# Patient Record
Sex: Female | Born: 1935 | Race: White | Hispanic: No | Smoking: Never smoker
Health system: Southern US, Community
[De-identification: ages and names within clinical notes are randomized; demographics above are authoritative.]

## PROBLEM LIST (undated history)

## (undated) DIAGNOSIS — I10 Essential (primary) hypertension: Secondary | ICD-10-CM

## (undated) DIAGNOSIS — I4891 Unspecified atrial fibrillation: Secondary | ICD-10-CM

---

## 2001-06-22 ENCOUNTER — Encounter: Admission: RE | Admit: 2001-06-22 | Discharge: 2001-06-22 | Payer: Self-pay | Admitting: Internal Medicine

## 2001-06-22 ENCOUNTER — Encounter: Payer: Self-pay | Admitting: Internal Medicine

## 2003-04-06 ENCOUNTER — Ambulatory Visit (HOSPITAL_COMMUNITY): Admission: RE | Admit: 2003-04-06 | Discharge: 2003-04-06 | Payer: Self-pay | Admitting: Internal Medicine

## 2016-05-01 ENCOUNTER — Emergency Department (HOSPITAL_COMMUNITY): Payer: Medicare HMO

## 2016-05-01 ENCOUNTER — Emergency Department (HOSPITAL_COMMUNITY)
Admission: EM | Admit: 2016-05-01 | Discharge: 2016-05-01 | Disposition: A | Discharge status: Home / Self Care | Payer: Medicare HMO | Attending: Emergency Medicine | Admitting: Emergency Medicine

## 2016-05-01 ENCOUNTER — Encounter (HOSPITAL_COMMUNITY): Payer: Self-pay | Admitting: Emergency Medicine

## 2016-05-01 DIAGNOSIS — S50311A Abrasion of right elbow, initial encounter: Secondary | ICD-10-CM | POA: Insufficient documentation

## 2016-05-01 DIAGNOSIS — S0990XA Unspecified injury of head, initial encounter: Secondary | ICD-10-CM | POA: Diagnosis present

## 2016-05-01 DIAGNOSIS — I10 Essential (primary) hypertension: Secondary | ICD-10-CM | POA: Diagnosis not present

## 2016-05-01 DIAGNOSIS — Y999 Unspecified external cause status: Secondary | ICD-10-CM | POA: Insufficient documentation

## 2016-05-01 DIAGNOSIS — Y929 Unspecified place or not applicable: Secondary | ICD-10-CM | POA: Diagnosis not present

## 2016-05-01 DIAGNOSIS — Z7901 Long term (current) use of anticoagulants: Secondary | ICD-10-CM | POA: Diagnosis not present

## 2016-05-01 DIAGNOSIS — S0181XA Laceration without foreign body of other part of head, initial encounter: Secondary | ICD-10-CM | POA: Diagnosis not present

## 2016-05-01 DIAGNOSIS — Y939 Activity, unspecified: Secondary | ICD-10-CM | POA: Insufficient documentation

## 2016-05-01 DIAGNOSIS — T148XXA Other injury of unspecified body region, initial encounter: Secondary | ICD-10-CM

## 2016-05-01 DIAGNOSIS — Z79899 Other long term (current) drug therapy: Secondary | ICD-10-CM | POA: Diagnosis not present

## 2016-05-01 DIAGNOSIS — W1839XA Other fall on same level, initial encounter: Secondary | ICD-10-CM | POA: Insufficient documentation

## 2016-05-01 DIAGNOSIS — W19XXXA Unspecified fall, initial encounter: Secondary | ICD-10-CM

## 2016-05-01 HISTORY — DX: Unspecified atrial fibrillation: I48.91

## 2016-05-01 HISTORY — DX: Essential (primary) hypertension: I10

## 2016-05-01 NOTE — ED Notes (Signed)
Pt refused x-rays.  Notified Dr Eudelia Bunchardama.

## 2016-05-01 NOTE — ED Provider Notes (Signed)
LACERATION REPAIR Performed by: Lawana Chambersansie,Legacie Dillingham Duncan Authorized by: Lawana Chambersansie,Jaynie Hitch Duncan Consent: Verbal consent obtained. Risks and benefits: risks, benefits and alternatives were discussed Consent given by: patient Patient identity confirmed: provided demographic data Prepped and Draped in normal sterile fashion Wound explored  Laceration Location: right lateral eye  Laceration Length: 1 cm  No Foreign Bodies seen or palpated  Anesthesia: N/A    Anesthetic total: N/A  Irrigation method: Shur-clens  Amount of cleaning: standard  Skin closure: Dermabond Skin glue   Number of sutures: N/A   Technique: Skin Glue  Laceration well approximated.   Patient tolerance: Patient tolerated the procedure well with no immediate complications.    Everlene FarrierWilliam Aitana Burry, PA-C 05/01/16 1539

## 2016-05-01 NOTE — ED Triage Notes (Signed)
Pt from shopping location via [facility] with c/o right eyebrow small laceration and bruising, right elbow abrasion and left hand abrasion s/p tripping and falling.  Witnessed with no LOC.  EKG unremarkable a-fib.  PERRLA.  On blood thinners.  NAD, A&O.

## 2016-05-01 NOTE — ED Provider Notes (Addendum)
[facility]-EMERGENCY DEPT Provider Note   CSN: [id number] Arrival date & time: 05/01/16  1146     History   Chief Complaint Chief Complaint  Patient presents with  . Fall  . Head Injury  . Arm Injury  . Hand Injury    HPI Sally Shaw is a 83 y.o. female.  The history is provided by the patient.   83 year old female with a history of A. fib currently on Xarelto presents to the ED after mechanical fall from standing. Patient reports getting out of the vehicle and tripping on a crack in the pavement falling onto the right side of her body and sustaining mild head trauma. Denies LOC. She sustained a small laceration over the right eye, abrasion to the right elbow, left wrist and is endorsing mild. Denies any other physical complaints at this time. Tetatus UTD.  Past Medical History:  Diagnosis Date  . A-fib (HCC)   . Hypertension     There are no active problems to display for this patient.   History reviewed. No pertinent surgical history.  OB History    No data available       Home Medications    Prior to Admission medications   Medication Sig Start Date End Date Taking? Authorizing Provider  acetaminophen (TYLENOL) 500 MG tablet Take 500 mg by mouth every 6 (six) hours as needed for pain.   Yes Historical Provider, MD  Cholecalciferol (VITAMIN D3) 2000 units capsule Take 2,000 Units by mouth daily.   Yes Historical Provider, MD  lisinopril (PRINIVIL,ZESTRIL) 20 MG tablet Take 20 mg by mouth daily. 04/06/16  Yes Historical Provider, MD  oxymetazoline (AFRIN) 0.05 % nasal spray Place 1 spray into both nostrils 2 (two) times daily as needed for congestion.   Yes Historical Provider, MD  PARoxetine (PAXIL) 20 MG tablet Take 20 mg by mouth every evening. 04/06/16  Yes Historical Provider, MD  pseudoephedrine (SUDAFED) 30 MG tablet Take 30 mg by mouth every 4 (four) hours as needed for congestion.   Yes Historical Provider, MD  XARELTO 20 MG TABS tablet Take 20 mg by mouth  every evening. 02/13/16  Yes Historical Provider, MD    Family History History reviewed. No pertinent family history.  Social History Social History  Substance Use Topics  . Smoking status: Never Smoker  . Smokeless tobacco: Never Used  . Alcohol use Yes     Allergies   Patient has no known allergies.   Review of Systems Review of Systems Ten systems are reviewed and are negative for acute change except as noted in the HPI   Physical Exam Updated Vital Signs BP 150/90   Pulse 82   Temp 97.8 F (36.6 C) (Oral)   Resp 20   SpO2 96%   Physical Exam  Constitutional: She is oriented to person, place, and time. She appears well-developed and well-nourished. No distress.  HENT:  Head: Normocephalic. Head is with laceration.    Right Ear: External ear normal.  Left Ear: External ear normal.  Nose: Nose normal.  Eyes: Conjunctivae and EOM are normal. Pupils are equal, round, and reactive to light. Right eye exhibits no discharge. Left eye exhibits no discharge. No scleral icterus.  Neck: Normal range of motion. Neck supple. No spinous process tenderness and no muscular tenderness present.  Cardiovascular: Normal rate, regular rhythm and normal heart sounds.  Exam reveals no gallop and no friction rub.   No murmur heard. Pulses:      Radial pulses are  2+ on the right side, and 2+ on the left side.       Dorsalis pedis pulses are 2+ on the right side, and 2+ on the left side.  Pulmonary/Chest: Effort normal and breath sounds normal. No stridor. No respiratory distress. She has no wheezes.  Abdominal: Soft. She exhibits no distension. There is no tenderness.  Musculoskeletal: She exhibits no edema.       Right hip: She exhibits tenderness (mild). She exhibits normal range of motion.       Cervical back: She exhibits no bony tenderness.       Thoracic back: She exhibits no bony tenderness.       Lumbar back: She exhibits no bony tenderness.  Clavicles stable. Chest stable  to AP/Lat compression. Pelvis stable to Lat compression. No obvious extremity deformity. No chest or abdominal wall contusion.  Neurological: She is alert and oriented to person, place, and time.  Moving all extremities  Skin: Skin is warm and dry. No rash noted. She is not diaphoretic. No erythema.     Psychiatric: She has a normal mood and affect.     ED Treatments / Results  Labs (all labs ordered are listed, but only abnormal results are displayed) Labs Reviewed - No data to display  EKG  EKG Interpretation None       Radiology Ct Head Wo Contrast  Result Date: 05/01/2016 CLINICAL DATA:  Fall.  Head injury EXAM: CT HEAD WITHOUT CONTRAST CT CERVICAL SPINE WITHOUT CONTRAST TECHNIQUE: Multidetector CT imaging of the head and cervical spine was performed following the standard protocol without intravenous contrast. Multiplanar CT image reconstructions of the cervical spine were also generated. COMPARISON:  None. FINDINGS: CT HEAD FINDINGS Brain: Mild atrophy. Mild chronic microvascular ischemic change in the white matter. Negative for acute infarct or hemorrhage. Negative for mass or edema. No shift of the midline structures. Vascular: Extensive carotid artery calcification. Negative for hyperdense vessel. Skull: Negative for fracture Sinuses/Orbits: Mucoperiosteal thickening in the maxillary sinus bilaterally. Prior medial antrostomy bilaterally. No orbital lesion. Other: None CT CERVICAL SPINE FINDINGS Alignment: Mild anterolisthesis C4-5 and C5-6.  Thoracic kyphosis. Skull base and vertebrae: Negative for fracture.  No mass lesion Soft tissues and spinal canal: Negative for mass or adenopathy Disc levels: Disc degeneration and spurring at C5-6 and C6-7. Cervical facet arthropathy. Upper chest: Negative Other: None IMPRESSION: No acute intracranial abnormality. Atrophy and mild chronic microvascular ischemic change Negative for cervical spine fracture. Electronically Signed   By:  Marlan Palau M.D.   On: 05/01/2016 13:44   Ct Cervical Spine Wo Contrast  Result Date: 05/01/2016 CLINICAL DATA:  Fall.  Head injury EXAM: CT HEAD WITHOUT CONTRAST CT CERVICAL SPINE WITHOUT CONTRAST TECHNIQUE: Multidetector CT imaging of the head and cervical spine was performed following the standard protocol without intravenous contrast. Multiplanar CT image reconstructions of the cervical spine were also generated. COMPARISON:  None. FINDINGS: CT HEAD FINDINGS Brain: Mild atrophy. Mild chronic microvascular ischemic change in the white matter. Negative for acute infarct or hemorrhage. Negative for mass or edema. No shift of the midline structures. Vascular: Extensive carotid artery calcification. Negative for hyperdense vessel. Skull: Negative for fracture Sinuses/Orbits: Mucoperiosteal thickening in the maxillary sinus bilaterally. Prior medial antrostomy bilaterally. No orbital lesion. Other: None CT CERVICAL SPINE FINDINGS Alignment: Mild anterolisthesis C4-5 and C5-6.  Thoracic kyphosis. Skull base and vertebrae: Negative for fracture.  No mass lesion Soft tissues and spinal canal: Negative for mass or adenopathy Disc levels: Disc degeneration and  spurring at C5-6 and C6-7. Cervical facet arthropathy. Upper chest: Negative Other: None IMPRESSION: No acute intracranial abnormality. Atrophy and mild chronic microvascular ischemic change Negative for cervical spine fracture. Electronically Signed   By: Marlan Palauharles  Clark M.D.   On: 05/01/2016 13:44    Procedures Procedures (including critical care time)  Medications Ordered in ED Medications - No data to display   Initial Impression / Assessment and Plan / ED Course  I have reviewed the triage vital signs and the nursing notes.  Pertinent labs & imaging results that were available during my care of the patient were reviewed by me and considered in my medical decision making (see chart for details).  Clinical Course as of May 01 1534  Thu May 01, 2016  1359 CT head and cervical spine negative. Patient refused plain films of the left hand, right elbow, pelvis. Wound thoroughly irrigated and closed using Dermabond the Everlene FarrierWilliam Dansie, [place]. Safe for discharge with strict return precautions.  [PC]    Clinical Course User Index [PC] Nira ConnPedro Eduardo Cardama, MD      Final Clinical Impressions(s) / ED Diagnoses   Final diagnoses:  Fall  Injury of head, initial encounter  Laceration of forehead, initial encounter  Abrasion   Disposition: Discharge  Condition: Good  I have discussed the results, Dx and Tx plan with the patient who expressed understanding and agree(s) with the plan. Discharge instructions discussed at great length. The patient was given strict return precautions who verbalized understanding of the instructions. No further questions at time of discharge.    New Prescriptions   No medications on file    Follow Up: primary care provider        Nira ConnPedro Eduardo Cardama, MD 05/01/16 1705

## 2017-10-11 IMAGING — CT CT HEAD W/O CM
4 of 7 series · 21 of 47 positions shown, 23 images · non-contrast
Comparison: None.

CLINICAL DATA: Fall.  Head injury

EXAM:
CT HEAD WITHOUT CONTRAST
CT CERVICAL SPINE WITHOUT CONTRAST
TECHNIQUE: Multidetector CT imaging of the head and cervical spine was
performed following the standard protocol without intravenous
contrast. Multiplanar CT image reconstructions of the cervical spine
were also generated.

[Series 203: coronal st, idose (1) · coronal · 0.40mm/px · 3 of 64 slices shown]
[im 22/64  brain]
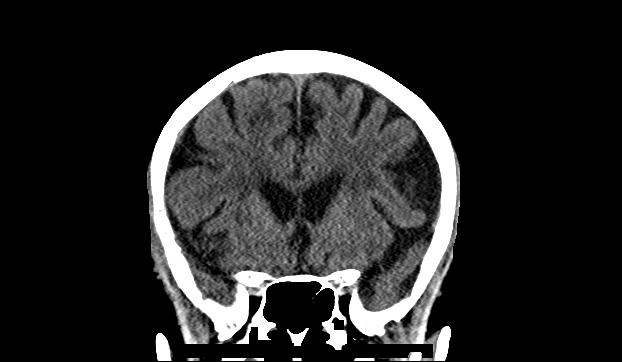
[im 29/64  brain]
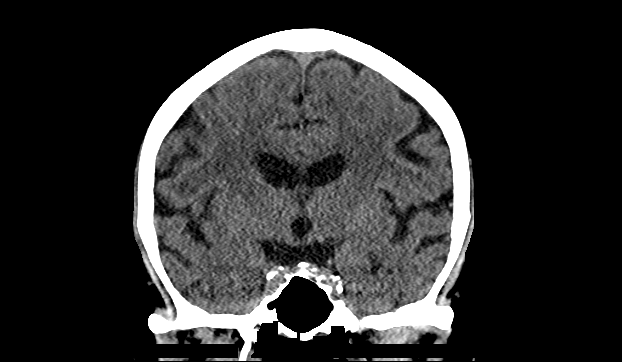
[im 36/64  brain]
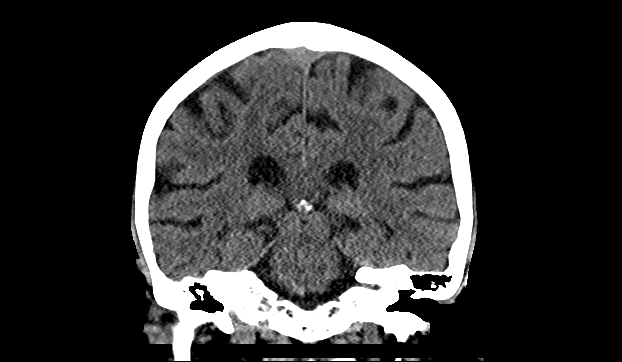

[Series 204: sagittal st, idose (1) · sagittal · 0.40mm/px · 2 of 55 slices shown]
[im 19/55  brain]
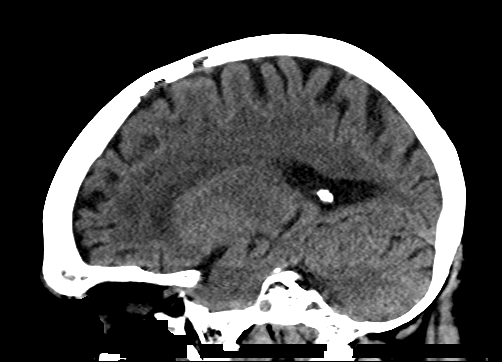
[im 37/55  brain]
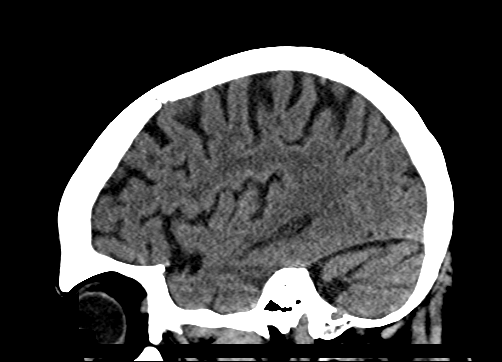

[Series 302: soft tissue, idose (2) · axial · 0.43mm/px · z∈[+933,+1083]mm · 8 of 97 slices shown, 10 images]
[im 11/97  brain]
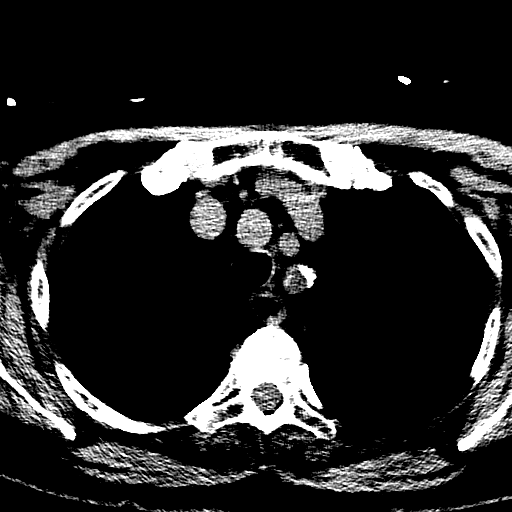
[im 11/97  bone]
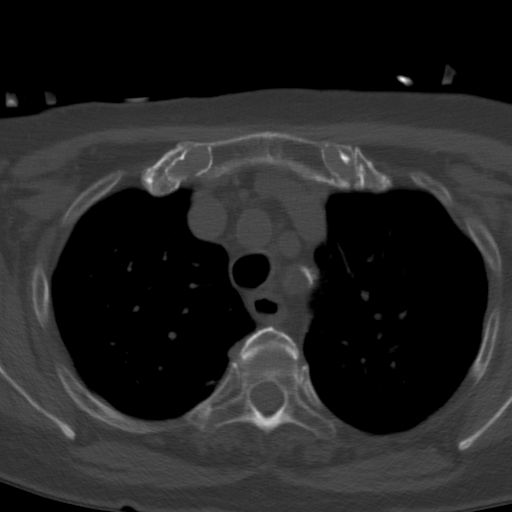
[im 22/97  brain]
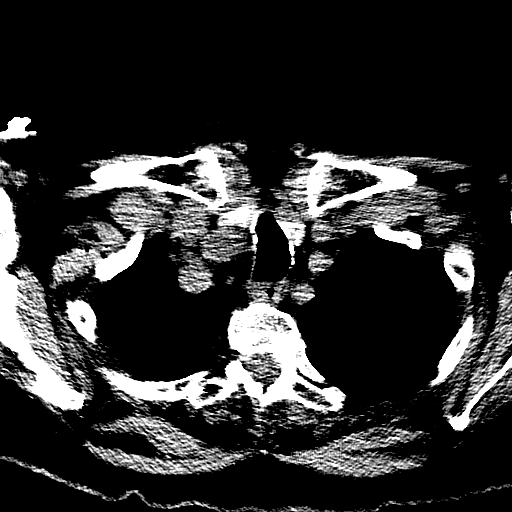
[im 33/97  brain]
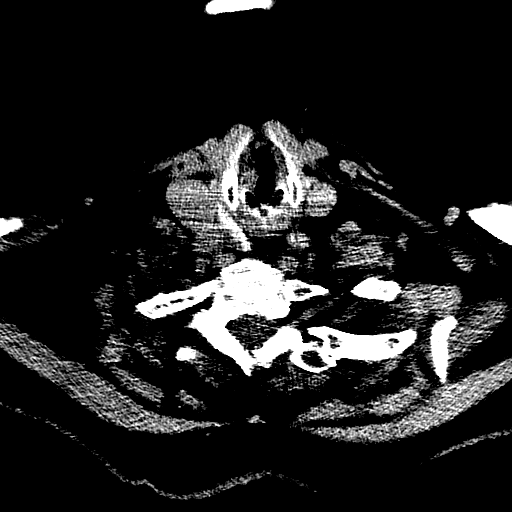
[im 43/97  brain]
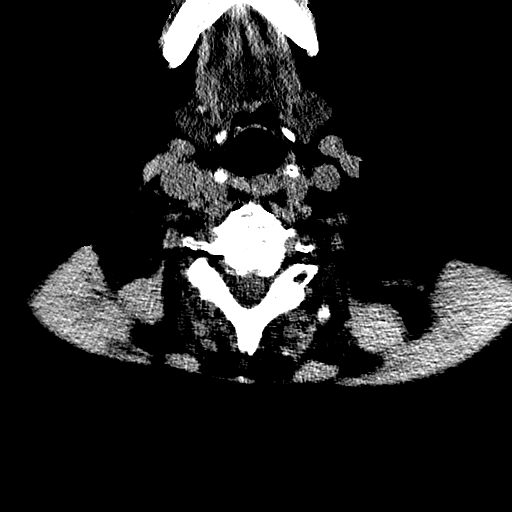
[im 54/97  brain]
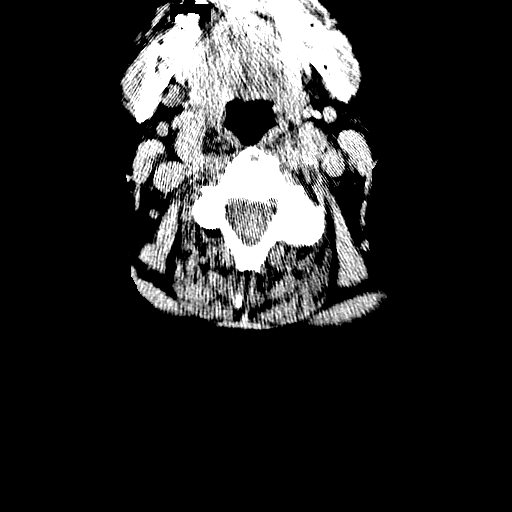
[im 54/97  bone]
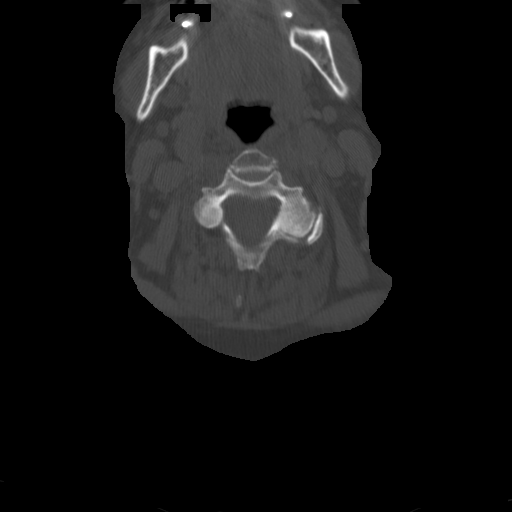
[im 65/97  brain]
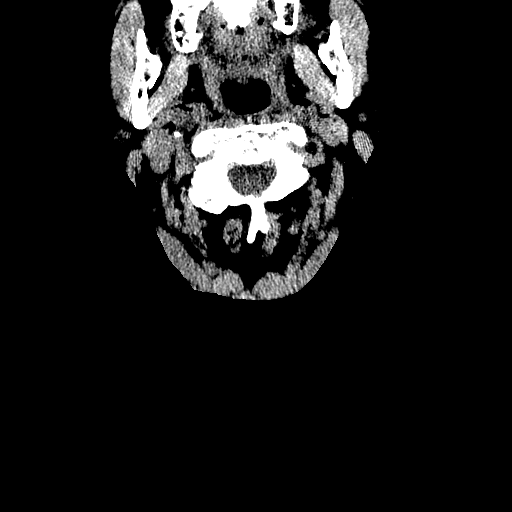
[im 75/97  brain]
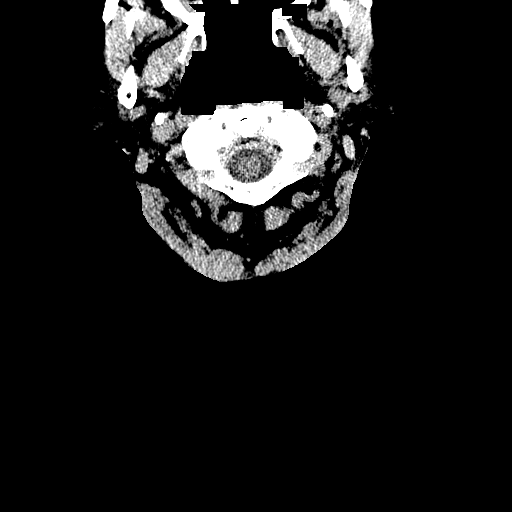
[im 86/97  brain]
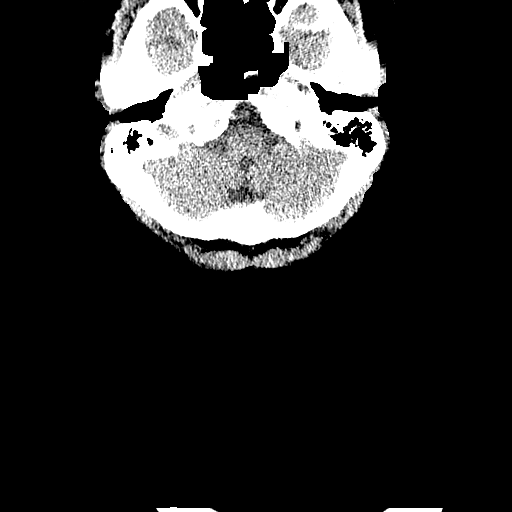

[Series 306: orthogonals, idose (2) · axial · 0.52mm/px · z∈[+900,+1025]mm · 8 of 96 slices shown]
[im 11/96  brain]
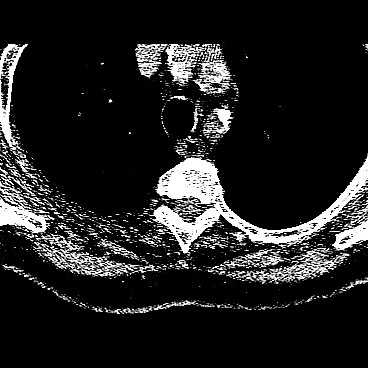
[im 22/96  brain]
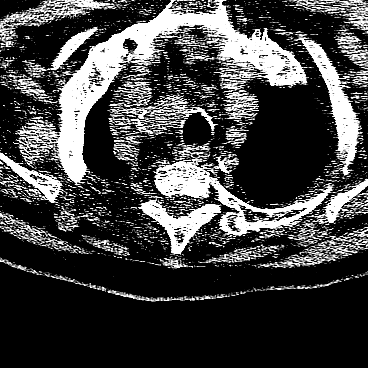
[im 32/96  brain]
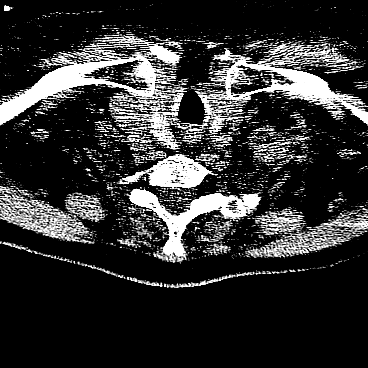
[im 43/96  brain]
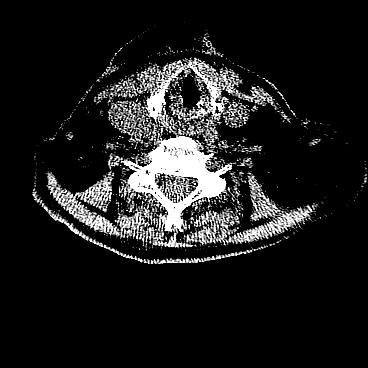
[im 53/96  brain]
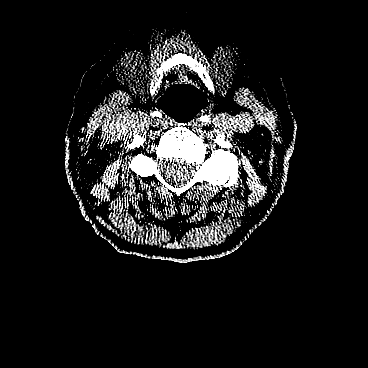
[im 64/96  brain]
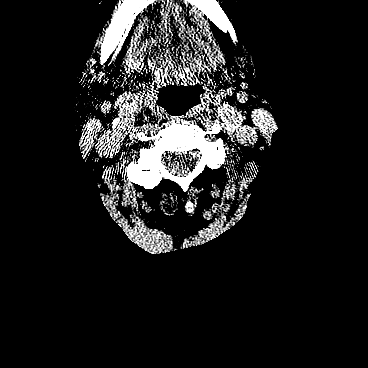
[im 74/96  brain]
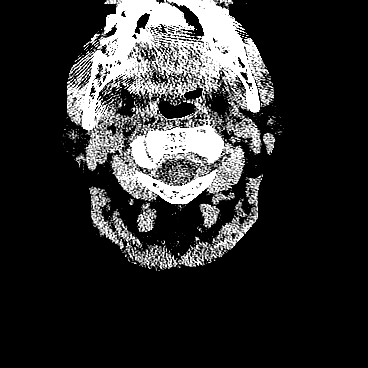
[im 85/96  brain]
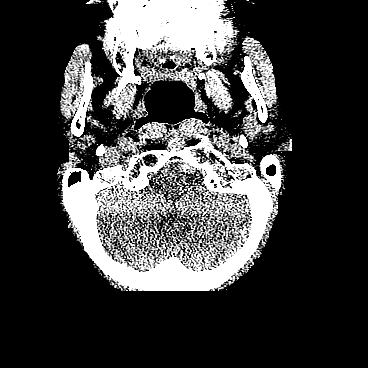

[21 of 47 positions shown; findings below may reference images not displayed]

FINDINGS: CT HEAD FINDINGS

Brain: Mild atrophy. Mild chronic microvascular ischemic change in
the white matter. Negative for acute infarct or hemorrhage. Negative
for mass or edema. No shift of the midline structures.

Vascular: Extensive carotid artery calcification. Negative for
hyperdense vessel.

Skull: Negative for fracture

Sinuses/Orbits: Mucoperiosteal thickening in the maxillary sinus
bilaterally. Prior medial antrostomy bilaterally. No orbital lesion.

Other: None

CT CERVICAL SPINE FINDINGS

Alignment: Mild anterolisthesis C4-5 and C5-6.  Thoracic kyphosis.

Skull base and vertebrae: Negative for fracture.  No mass lesion

Soft tissues and spinal canal: Negative for mass or adenopathy

Disc levels: Disc degeneration and spurring at C5-6 and C6-7.
Cervical facet arthropathy.

Upper chest: Negative

Other: None
IMPRESSION: No acute intracranial abnormality. Atrophy and mild chronic
microvascular ischemic change

Negative for cervical spine fracture.
